# Patient Record
Sex: Female | Born: 1990 | Race: White | Hispanic: No | Marital: Single | Smoking: Never smoker
Health system: Southern US, Community
[De-identification: ages and names within clinical notes are randomized; demographics above are authoritative.]

---
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
## 2008-12-15 ENCOUNTER — Ambulatory Visit: Payer: Self-pay | Admitting: Internal Medicine

## 2010-04-15 ENCOUNTER — Ambulatory Visit: Payer: Self-pay | Admitting: Internal Medicine

## 2010-04-18 ENCOUNTER — Ambulatory Visit: Payer: Self-pay | Admitting: Internal Medicine

## 2010-04-25 ENCOUNTER — Ambulatory Visit: Payer: Self-pay | Admitting: Internal Medicine

## 2010-05-29 ENCOUNTER — Ambulatory Visit: Payer: Self-pay | Admitting: Sports Medicine

## 2010-05-29 DIAGNOSIS — R55 Syncope and collapse: Secondary | ICD-10-CM

## 2010-06-12 ENCOUNTER — Ambulatory Visit (HOSPITAL_COMMUNITY)
Admission: RE | Admit: 2010-06-12 | Discharge: 2010-06-12 | Payer: Self-pay | Source: Home / Self Care | Attending: Sports Medicine | Admitting: Sports Medicine

## 2010-06-12 ENCOUNTER — Other Ambulatory Visit: Payer: Self-pay | Admitting: Sports Medicine

## 2010-06-13 ENCOUNTER — Encounter: Payer: Self-pay | Admitting: Sports Medicine

## 2010-06-19 ENCOUNTER — Ambulatory Visit (HOSPITAL_COMMUNITY)
Admission: RE | Admit: 2010-06-19 | Discharge: 2010-06-19 | Payer: Self-pay | Source: Home / Self Care | Attending: Sports Medicine | Admitting: Sports Medicine

## 2010-06-19 ENCOUNTER — Ambulatory Visit
Admission: RE | Admit: 2010-06-19 | Discharge: 2010-06-19 | Payer: Self-pay | Source: Home / Self Care | Attending: Sports Medicine | Admitting: Sports Medicine

## 2010-07-11 NOTE — Assessment & Plan Note (Signed)
Summary: 1:00APPT,ETT PER [facility]   History of Present Illness: ETT  Patient underwent standard Bruce Protocol with modiefied recovery based on Nishishime  Indication : Syncope  Resting EKG: RSR' V1 and Rt / verticle axis Total TM time is 12.5 mins Test terminated at HR of 200 2/2 fatigue Est Mets 14 - this equates to a VO2 Max of  ~ 50 - very good for age and High fitness level estimated at 82% for age No significant ST or T wave change Normal BP response HR Recovery at 1 minute is 27 beats which is excellent/ 70 beats at 3 mins No abnormal rhythm changes no Symptoms  Impression: negative adequate ETT High Fitness level for age   Impression & Recommendations:  Problem # 1:  SYNCOPE AND COLLAPSE (ICD-780.2) With normal ECHO and excellent ETT, her risk for other even rarer forms of heart disease is < average for her age With that in mind I suggested return to full activity Note she has been running up to 12 miles on TM while being observed  I suspect original episode was exercise associated hypoglycemia as she consumed only 200 calories morning of half marathon and was at party evening before with no real calorie intake for  ~ 12 hours Half marthon requirs  ~ 1400 to 1500 calories  were symptoms to recure she would need to do holter and have a cardiology consult  copy to Dr Lenord Fellers  Other Orders: No Charge Patient Arrived (NCPA0) (NCPA0)   Orders Added: 1)  No Charge Patient Arrived (NCPA0) [NCPA0]

## 2010-07-11 NOTE — Assessment & Plan Note (Signed)
Summary: NP MARATHON COLLAPSE/MJD   History of Present Illness: 20 yo F here for recent 1/2 marathon collapse.  Has previously completed 3 half marathons in past without issue.  However, 1.5 months ago while doing a half marathon in [place] she collapsed 100 yards from finish line.  Was "unconscious" for 3 minutes, then per EMS her eyes opened but wouldn't "respond" for 10 minutes. No seizure.  Did not require CPR or airway procedures.  States she doesn't remember much after mile 11 during the run.  Was very thirsty throughout the race, may not have hydrated well the day before.  Unclear if she felt dizzy for a little while prior to collapsing, or just collapsed suddenly.  Denies any new meds, only taking minocycline for acne.  Denies caffeine or other supplement/energy drink.  States only drank water for few days prior to race.  Denies prior chest pain, palpitations, or prior syncope.  after first 1/2 marathon, did feel slightly woozy, but no syncope.  Denies family h/o sudden cardiac death.  No other medical problems.  Does not think she is a heavy sweater.  No DM.  Morning prior to race ate a bagel with peanut butter, night before ate hot dog, chips, and bagel.  At [facility] ED, had EKG and blood work and told that everything was mostly fine, just some increased CK and LFTs.  Was told to have repeat blood work 2 weeks later, and everything ok.  States her dad has MVP.  She is referred by Dr. Sharlet Salina.  Past History:  Past Medical History: denies  Past Surgical History: denies  Family History: CAD in grandfather at older age Father with MVP  Social History: Occupation: Consulting civil engineer at [employer] Never Smoked Alcohol use-yes Occupation:  employed Smoking Status:  never  Review of Systems  The patient denies anorexia, fever, weight loss, weight gain, vision loss, chest pain, syncope, dyspnea on exertion, peripheral edema, headaches, abdominal pain, difficulty walking,  and depression.    Physical Exam  General:  Well-developed,well-nourished,in no acute distress; alert,appropriate and cooperative throughout examination Head:  normocephalic.   Eyes:  vision grossly intact.   Neck:  supple.   Lungs:  Normal respiratory effort, chest expands symmetrically. Lungs are clear to auscultation, no crackles or wheezes. Heart:  Normal rate and regular rhythm. S1 and S2 normal without gallop, murmur, click, rub or other extra sounds.  No murmur or extra sounds with valsalva, leaning forward, or Left Lat decub position Abdomen:  soft.   Neurologic:  alert & oriented X3 and cranial nerves II-XII intact.   Psych:  Oriented X3 and normally interactive.     Impression & Recommendations:  Problem # 1:  SYNCOPE AND COLLAPSE (ICD-780.2) Given father's h/o MVP, some memory loss/confusion, and collapse during a race, this is somewhat worrisome.  Sounds like no seizure activity.  Certainly need to discern if electrical or anatomical abnormalities in heart are present.  Also consider hyponatremia, hypovolemia, hypoglycemia, but all less likely. - EKG today with NSR, but did have inverted T wave and very small Q wave in lead III, also with small RsR' in V1.  Normal intervals and axis without sig hypertrophy. - Will go ahead and get ETT set up with Dr. Darrick Penna, as well as TTE - If both of these are normal, will plan to get holter monitor to be worn during a long run - F/u after above testing - Until then, have recommended low intensity exercise only with another person  present  Orders: EKG Regular (93005)  Patient Instructions: 1)  2-D ECHO TEST IS SCHD FOR WED JAN 4TH AT 10AM AT [facility]. [phone]. REGISTER IN ADMITTION AT 9:30. 2)  ETT IS SCHD FOR WED JAN 11TH AT 1PM AT [facility]. FOLLOW THE DIRECTIONS ON THE BLUE PROTOCOL FORM. [phone]   Orders Added: 1)  Consultation Level III [57846] 2)  EKG Regular [93005]

## 2010-07-11 NOTE — Letter (Signed)
Summary: *Consult Note  [facility]  [address]   [place], [place] [address]   Phone: [phone]  Fax: [phone]    Re:    [name] DOB:    August 08, 1990 Sharlet Salina, MD Fax: [phone]  05/29/10   Dear Courtney Chaney:    Thank you for requesting that we see the above patient for consultation.  A copy of the detailed office note will be sent under separate cover, for your review.  Evaluation today is consistent with:  1)  SYNCOPE AND COLLAPSE (ICD-780.2)  Our recommendation is for cardiac evaluation.  On history this clearly occurred before finish line which is the highest risk zone for cardiac conditions. In fact she does not remember much from mile 11 to mile 13.  this pattern is consistent with what has been noted for marathoners with arrythmia problems.  While her examination is normal and she does have a history of successful completion of half marathons prior to this, I think she warrants a cardiac evaluation to include: ETT and ECHO - if both normal then proceed with a holter worn during a 2 hour and 30 minute run.  If all those tests are normal, she should be able to continue her plans to complete a marathon unless new events occur.  We have arranged the ECHO and ETT unless you wish any changes.   New Orders include:  1)  Consultation Level III [32355] 2)  EKG Regular [93005]   Thank you for this consultation.  If you have any further questions regarding the care of this patient, please do not hesitate to contact me @ [phone].  Thank you for this opportunity to look after your patient.  Sincerely,  Vincent Gros MD

## 2014-01-03 ENCOUNTER — Telehealth: Payer: Self-pay

## 2014-01-03 DIAGNOSIS — M899 Disorder of bone, unspecified: Secondary | ICD-10-CM

## 2014-01-03 NOTE — Telephone Encounter (Signed)
Patient mother calls stating she has a know on the left side of her head. About the size of a golfball and painful. Per Dr. Lenord FellersBaxley, will order a CT head for tomorrow and see her in the AM

## 2014-01-03 NOTE — Telephone Encounter (Signed)
Patient has been scheduled for a CT  Of the head 01/04/2014 at 4:00pm. Mother notified.

## 2014-01-03 NOTE — Telephone Encounter (Signed)
Patient's mother informed

## 2014-01-04 ENCOUNTER — Ambulatory Visit
Admission: RE | Admit: 2014-01-04 | Discharge: 2014-01-04 | Disposition: A | Discharge status: Home / Self Care | Payer: PRIVATE HEALTH INSURANCE | Source: Ambulatory Visit | Attending: Internal Medicine | Admitting: Internal Medicine

## 2014-01-04 ENCOUNTER — Ambulatory Visit (INDEPENDENT_AMBULATORY_CARE_PROVIDER_SITE_OTHER): Payer: PRIVATE HEALTH INSURANCE | Admitting: Internal Medicine

## 2014-01-04 ENCOUNTER — Encounter: Payer: Self-pay | Admitting: Internal Medicine

## 2014-01-04 VITALS — Wt 135.5 lb

## 2014-01-04 DIAGNOSIS — Z3009 Encounter for other general counseling and advice on contraception: Secondary | ICD-10-CM

## 2014-01-04 DIAGNOSIS — M898X9 Other specified disorders of bone, unspecified site: Secondary | ICD-10-CM

## 2014-01-04 DIAGNOSIS — M899 Disorder of bone, unspecified: Secondary | ICD-10-CM

## 2014-01-04 NOTE — Patient Instructions (Signed)
Have CT scan of brain without contrast to further study bony prominence left temple area today.

## 2014-01-04 NOTE — Progress Notes (Signed)
   Subjective:    Patient ID: Lujean AmelSarah F Lucking, female    DOB: 09/18/1990, 23 y.o.   MRN: [id number]  HPI Patient is visiting parents here from [place] where she'll be living in the near future. She is getting married in October. [place] is from [place]. Last seen here in 2011.  Patient was seen here in April 2008 with a bony prominence left temple area. A CT scan without contrast showed densely sclerotic exophytic bone lesion measuring 14 x 5 mm with benign appearance. No bony destruction or fracture noted. 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 Patient says this area has grown a bit over the past few years but she's not sure exactly when it grew. Has felt a little bit of pain in that area. No headaches. No visual disturbances. She's been worried about the growth of the lesion.  Also has questions about birth control. Is considering a Nuva Ring. Has thought about Nexplanon.  Is on oral contraceptives but sometimes doesn't remember to take them. Taught with her about various options including an IUD but she has heard that is painful to insert.    Review of Systems     Objective:   Physical Exam left temporal area firm bony prominence measures 22 x 7 mm. It's an oval shaped lesion. PERRLA. Fundi are benign with dyspnea and sharp and flat bilaterally. Cranial nerves II through XII are grossly intact. Cerebellar finger to nose testing normal. Gait is normal. Deep tendon reflexes 2+ and symmetrical. Muscle strength is normal. Extraocular movements are full.        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 It's likely a benign process but since it has grown patient is concerned and would like to have it re- studied by CAT scan.

## 2014-01-05 ENCOUNTER — Telehealth: Payer: Self-pay | Admitting: Internal Medicine

## 2014-01-05 NOTE — Telephone Encounter (Signed)
Spoke with Dr. Lenord FellersBaxley and reviewed report.  Per Dr. Lenord FellersBaxley; advised patient that the lesion is benign.  It has slightly increased in size.  Patient can drop by the office and pick up a copy of this most recent report to carry with [place]    Spoke with patient; provided report information.  Patient wanted to know the actual size.  Advised it has increased to 1 x 2.3 cm.  Patient will drop to pick up a copy of the report.

## 2015-03-02 IMAGING — CT CT HEAD W/O CM
2 of 4 series · 15 of 40 positions shown, 18 images · non-contrast
Comparison: 09/08/2006

CLINICAL DATA: Exophytic bone lesion left temporal region 8 years.
No pain. No history of cancer.

EXAM:
CT HEAD WITHOUT CONTRAST
TECHNIQUE: Contiguous axial images were obtained from the base of the skull
through the vertex without intravenous contrast.

[Series 2: head w/o · axial · non-contrast · 0.45mm/px · z∈[+46,+186]mm · 12 of 32 slices shown, 15 images]
[im 3/32  brain]
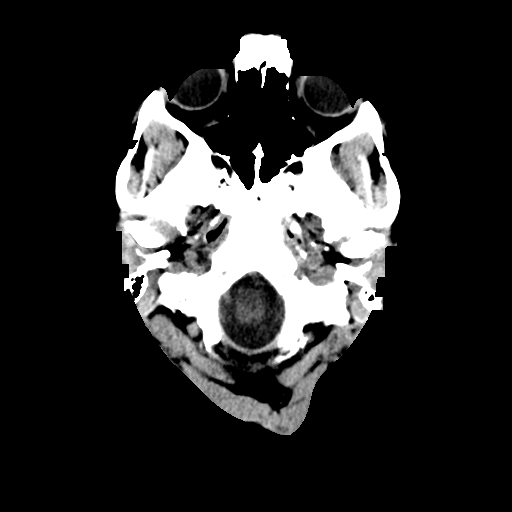
[im 3/32  bone]
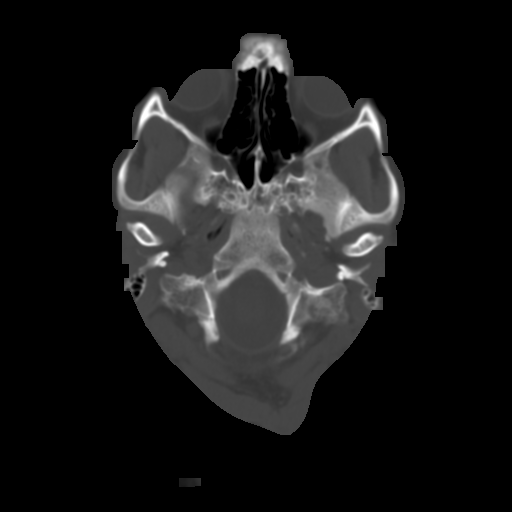
[im 5/32  brain]
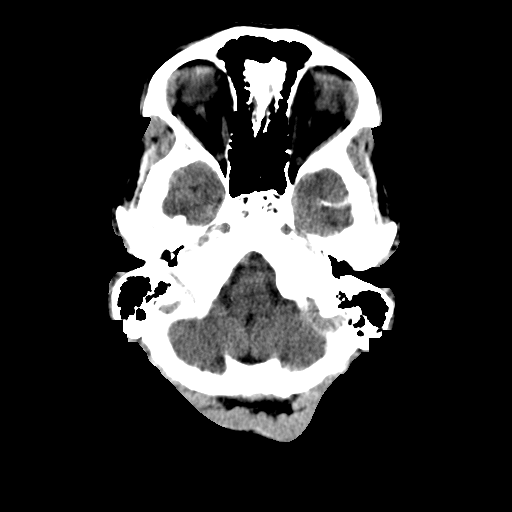
[im 7/32  brain]
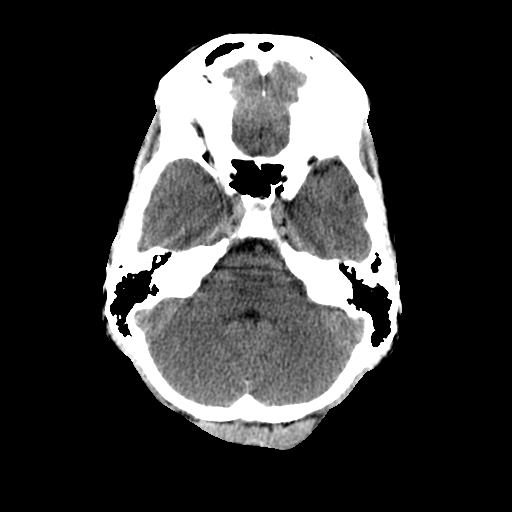
[im 9/32  brain]
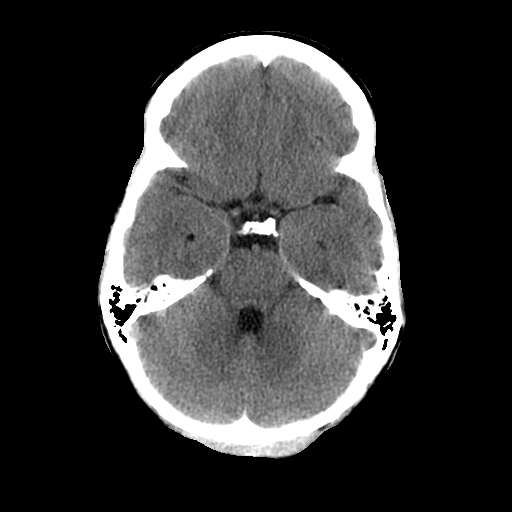
[im 12/32  brain]
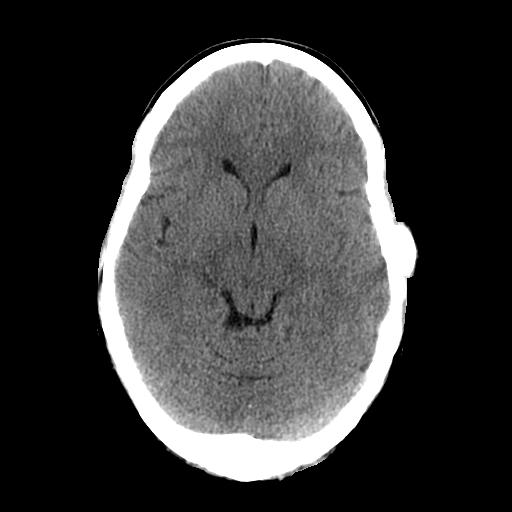
[im 12/32  bone]
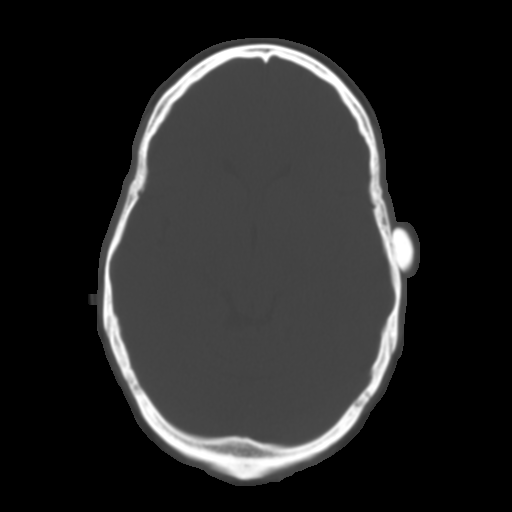
[im 14/32  brain]
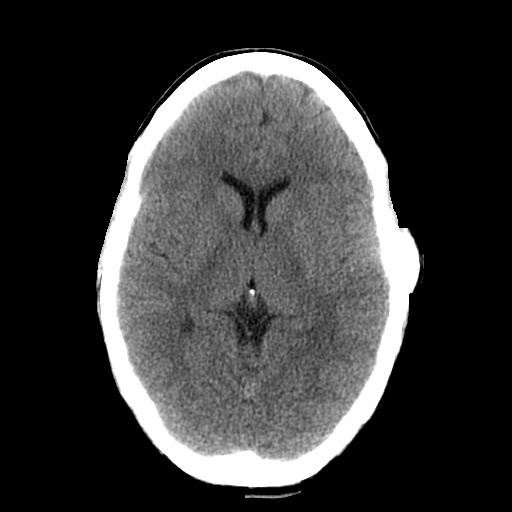
[im 18/32  brain]
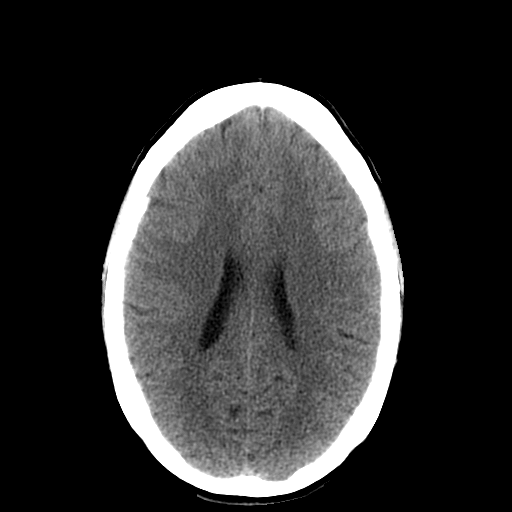
[im 20/32  brain]
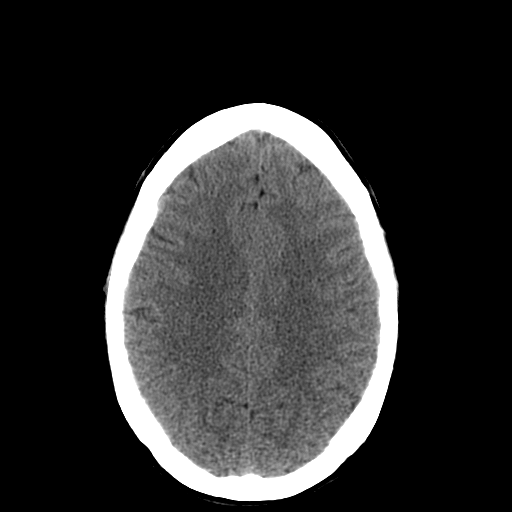
[im 23/32  brain]
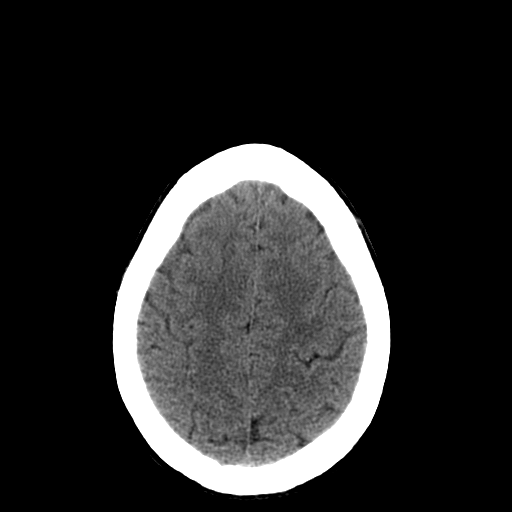
[im 23/32  bone]
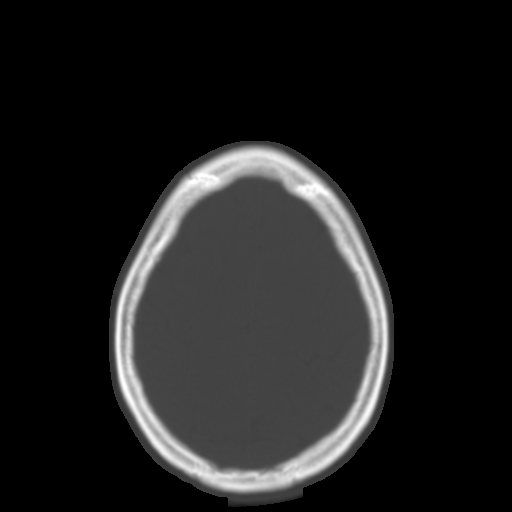
[im 25/32  brain]
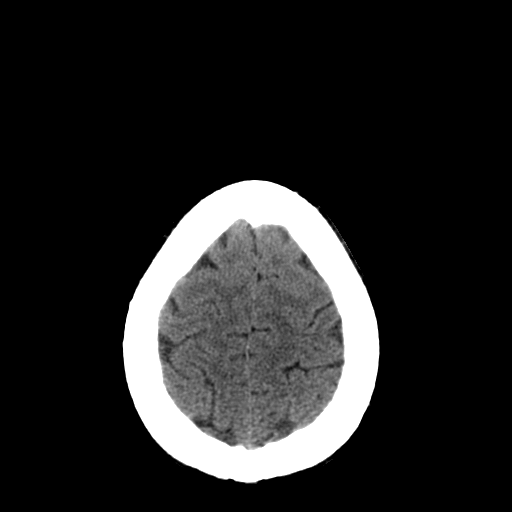
[im 27/32  brain]
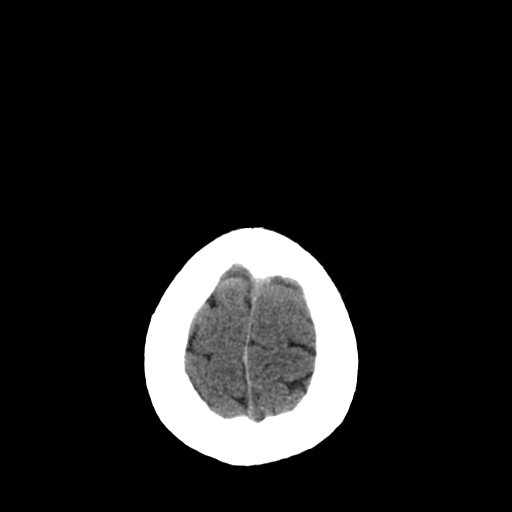
[im 29/32  brain]
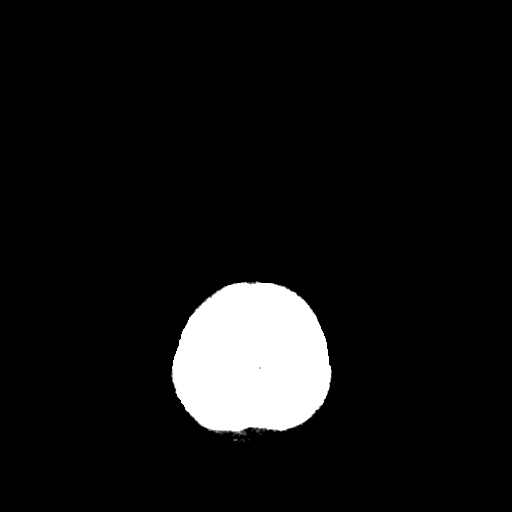

[Series 103: cor bone · coronal · 0.45mm/px · 3 of 110 slices shown]
[im 22/110  brain]
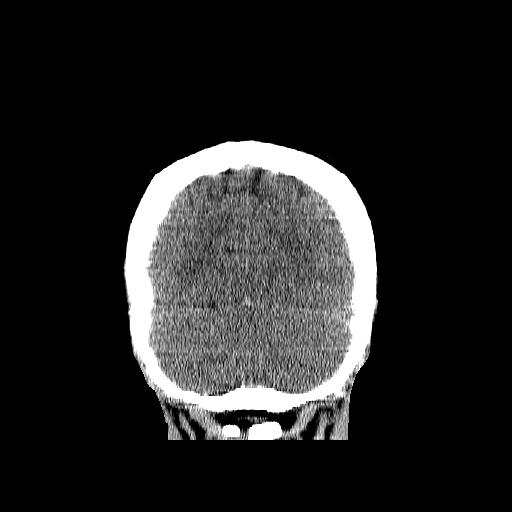
[im 44/110  brain]
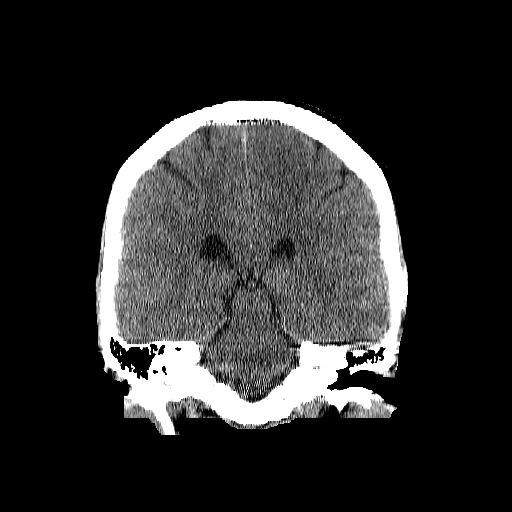
[im 66/110  brain]
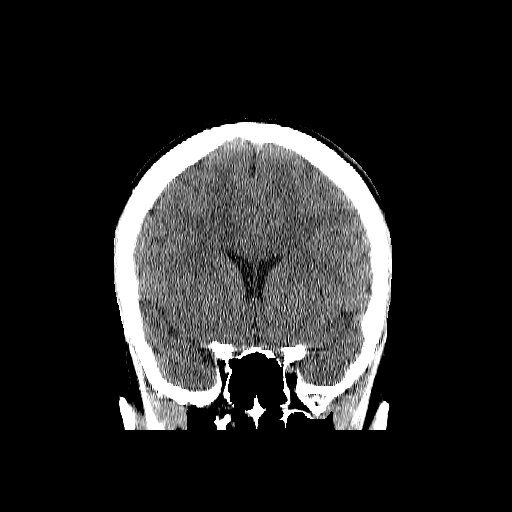

[15 of 40 positions shown; findings below may reference images not displayed]

FINDINGS: Ventricles, cisterns and other CSF spaces are within normal. There
is no mass, mass effect, shift of midline structures or acute
hemorrhage. There is no evidence of acute infarction. Again noted is
a well-defined oval exophytic densely sclerotic lesion over the left
temporoparietal skull which has increased in size and measures 1 x
2.3 cm. No significant involvement of the underlying calvarium.
IMPRESSION: No acute intracranial findings.

Slight increase in size of a densely sclerotic well-defined old
exophytic mass over the left temporoparietal skull measuring 1 x
cm likely an osteoma.
